# Patient Record
Sex: Female | Born: 1960 | Race: White | Hispanic: No | Marital: Married | Smoking: Never smoker
Health system: Southern US, Community
[De-identification: ages and names within clinical notes are randomized; demographics above are authoritative.]

---

## 2017-03-16 ENCOUNTER — Emergency Department (INDEPENDENT_AMBULATORY_CARE_PROVIDER_SITE_OTHER): Payer: Commercial Managed Care - PPO

## 2017-03-16 ENCOUNTER — Emergency Department
Admission: EM | Admit: 2017-03-16 | Discharge: 2017-03-16 | Disposition: A | Discharge status: Home / Self Care | Payer: Commercial Managed Care - PPO | Source: Home / Self Care | Attending: Family Medicine | Admitting: Family Medicine

## 2017-03-16 ENCOUNTER — Encounter: Payer: Self-pay | Admitting: Emergency Medicine

## 2017-03-16 DIAGNOSIS — S99921A Unspecified injury of right foot, initial encounter: Secondary | ICD-10-CM

## 2017-03-16 DIAGNOSIS — W19XXXA Unspecified fall, initial encounter: Secondary | ICD-10-CM

## 2017-03-16 DIAGNOSIS — Z760 Encounter for issue of repeat prescription: Secondary | ICD-10-CM

## 2017-03-16 DIAGNOSIS — M25571 Pain in right ankle and joints of right foot: Secondary | ICD-10-CM

## 2017-03-16 DIAGNOSIS — M25471 Effusion, right ankle: Secondary | ICD-10-CM

## 2017-03-16 MED ORDER — ALBUTEROL SULFATE (2.5 MG/3ML) 0.083% IN NEBU: 2.5000 mg | INHALATION_SOLUTION | Freq: Four times a day (QID) | RESPIRATORY_TRACT | 5 refills | Status: AC | PRN

## 2017-03-16 NOTE — Discharge Instructions (Signed)
°  Your imaging today was read as no fracture or dislocation, however, if pain, swelling, bruising continue to worsen over the next 1 week, it is recommended you follow up with primary care or Sports Medicine as they may repeat the x-ray to rule out a very tiny fracture (broken bone) as sometimes fractures are so small they are difficult to see until the bone starts to heal.

## 2017-03-16 NOTE — ED Provider Notes (Signed)
CSN: [id number]     Arrival date & time 03/16/17  1058 History   First MD Initiated Contact with Patient 03/16/17 1144     Chief Complaint  Patient presents with  . Ankle Pain   (Consider location/radiation/quality/duration/timing/severity/associated sxs/prior Treatment) HPI  Cindy Becker is a 56 y.o. female presenting to UC with husband c/o Right ankle and foot pain with swelling and bruising that started 2 days ago after trip and fall in the middle of the night while walking to the bathroom.  Pain is aching and sore, worse with ambulation but pt notes she is on chronic pain medication and uses a cane to help ambulate so she is concerned the medication is masking the pain as her foot is more bruised and swollen than 2 days ago.     History reviewed. No pertinent past medical history. History reviewed. No pertinent surgical history. History reviewed. No pertinent family history. Social History  Substance Use Topics  . Smoking status: Never Smoker  . Smokeless tobacco: Never Used  . Alcohol use No   OB History    No data available     Review of Systems  Musculoskeletal: Positive for arthralgias, gait problem, joint swelling and myalgias.  Skin: Positive for color change. Negative for wound.  Neurological: Negative for weakness and numbness.    Allergies  Patient has no allergy information on record.  Home Medications   Prior to Admission medications   Medication Sig Start Date End Date Taking? Authorizing Provider  cyclobenzaprine (FLEXERIL) 10 MG tablet Take 10 mg by mouth 3 (three) times daily as needed for muscle spasms.   Yes Historical Provider, MD  doxycycline (ORACEA) 40 MG capsule Take 40 mg by mouth every morning.   Yes Historical Provider, MD  DULoxetine (CYMBALTA) 20 MG capsule Take 20 mg by mouth daily.   Yes Historical Provider, MD  furosemide (LASIX) 10 MG/ML solution Take by mouth daily.   Yes Historical Provider, MD  lisinopril (PRINIVIL,ZESTRIL) 10 MG tablet  Take 10 mg by mouth daily.   Yes Historical Provider, MD  metoprolol succinate (TOPROL-XL) 100 MG 24 hr tablet Take 100 mg by mouth daily. Take with or immediately following a meal.   Yes Historical Provider, MD  metoprolol-hydrochlorothiazide (LOPRESSOR HCT) 50-25 MG tablet Take 1 tablet by mouth daily.   Yes Historical Provider, MD  pregabalin (LYRICA) 100 MG capsule Take 100 mg by mouth 2 (two) times daily.   Yes Historical Provider, MD  albuterol (PROVENTIL) (2.5 MG/3ML) 0.083% nebulizer solution Take 3 mLs (2.5 mg total) by nebulization every 6 (six) hours as needed for wheezing or shortness of breath. 03/16/17   Junius Finner, PA-C   Meds Ordered and Administered this Visit  Medications - No data to display  BP 90/61 (BP Location: Right Arm)   Temp 97.6 F (36.4 C) (Oral)   Wt 240 lb (108.9 kg)   SpO2 96%  No data found.   Physical Exam  Constitutional: She is oriented to person, place, and time. She appears well-developed and well-nourished.  HENT:  Head: Normocephalic and atraumatic.  Eyes: EOM are normal.  Neck: Normal range of motion.  Cardiovascular: Normal rate.   Pulses:      Dorsalis pedis pulses are 2+ on the right side.  Pulmonary/Chest: Effort normal.  Musculoskeletal: Normal range of motion. She exhibits edema and tenderness.  Right ankle and foot: mild to moderate edema with tenderness to lateral aspect ankle and foot. Full ROM.   Neurological: She is alert  and oriented to person, place, and time.  Antalgic gait. Pt uses cane for assistance ambulating.   Skin: Skin is warm and dry. Capillary refill takes less than 2 seconds.  Right ankle and foot: skin in tact. Ecchymosis noted to foot. Worse over 4th and 5th metatarsals.   Psychiatric: She has a normal mood and affect. Her behavior is normal.  Nursing note and vitals reviewed.   Urgent Care Course     Procedures (including critical care time)  Labs Review Labs Reviewed - No data to display  Imaging  Review Dg Ankle Complete Right  Result Date: 03/16/2017 CLINICAL DATA:  Pt states she fell Thursday night and injured her right ankle/right foot. c/o lateral pain with bruising and swelling. EXAM: RIGHT ANKLE - COMPLETE 3+ VIEW COMPARISON:  None. FINDINGS: Osseous alignment is normal. No fracture line or displaced fracture fragment seen. Ankle mortise is symmetric. Soft tissue swelling, particularly over the lateral malleolus. IMPRESSION: 1. No osseous fracture or dislocation. 2. Soft tissue swelling/edema. Electronically Signed   By: Bary Richard M.D.   On: 03/16/2017 12:25   Dg Foot Complete Right  Result Date: 03/16/2017 CLINICAL DATA:  Pt states she fell Thursday night and injured her right ankle/right foot. c/o lateral pain with bruising and swelling. EXAM: RIGHT FOOT COMPLETE - 3+ VIEW COMPARISON:  None. FINDINGS: Osseous alignment is normal. No fracture line or displaced fracture fragment identified. Soft tissues about the right foot are unremarkable. IMPRESSION: Negative. Electronically Signed   By: Bary Richard M.D.   On: 03/16/2017 12:26      MDM   1. Right foot injury, initial encounter   2. Pain and swelling of right ankle   3. Medication refill    Plain films negative for fracture or dislocation, however, question occult fracture given swelling and bruising.   Will place pt in cam walker boot for 1 week and encouraged to f/u with PCP or Sports Medicine for likely repeat imaging if not improving in 1-2 weeks.  Pt also requesting refill for unrelated albuterol nebulizer solution as she noticed hers is out of date.  Refill sent to pharmacy.   Junius Finner, PA-C 03/16/17 [phone]

## 2017-03-16 NOTE — ED Triage Notes (Signed)
Pt states she fell while walking to the bathroom 2 nights ago and injured both knees and ankles. States pain is worse on right side.

## 2017-10-08 IMAGING — DX DG FOOT COMPLETE 3+V*R*
3 series · 3 of 3 positions shown · non-contrast
Comparison: None.

CLINICAL DATA: Pt states she fell [REDACTED] night and injured her
right ankle/right foot. c/o lateral pain with bruising and swelling.

EXAM:
RIGHT FOOT COMPLETE - 3+ VIEW

[foot ap]
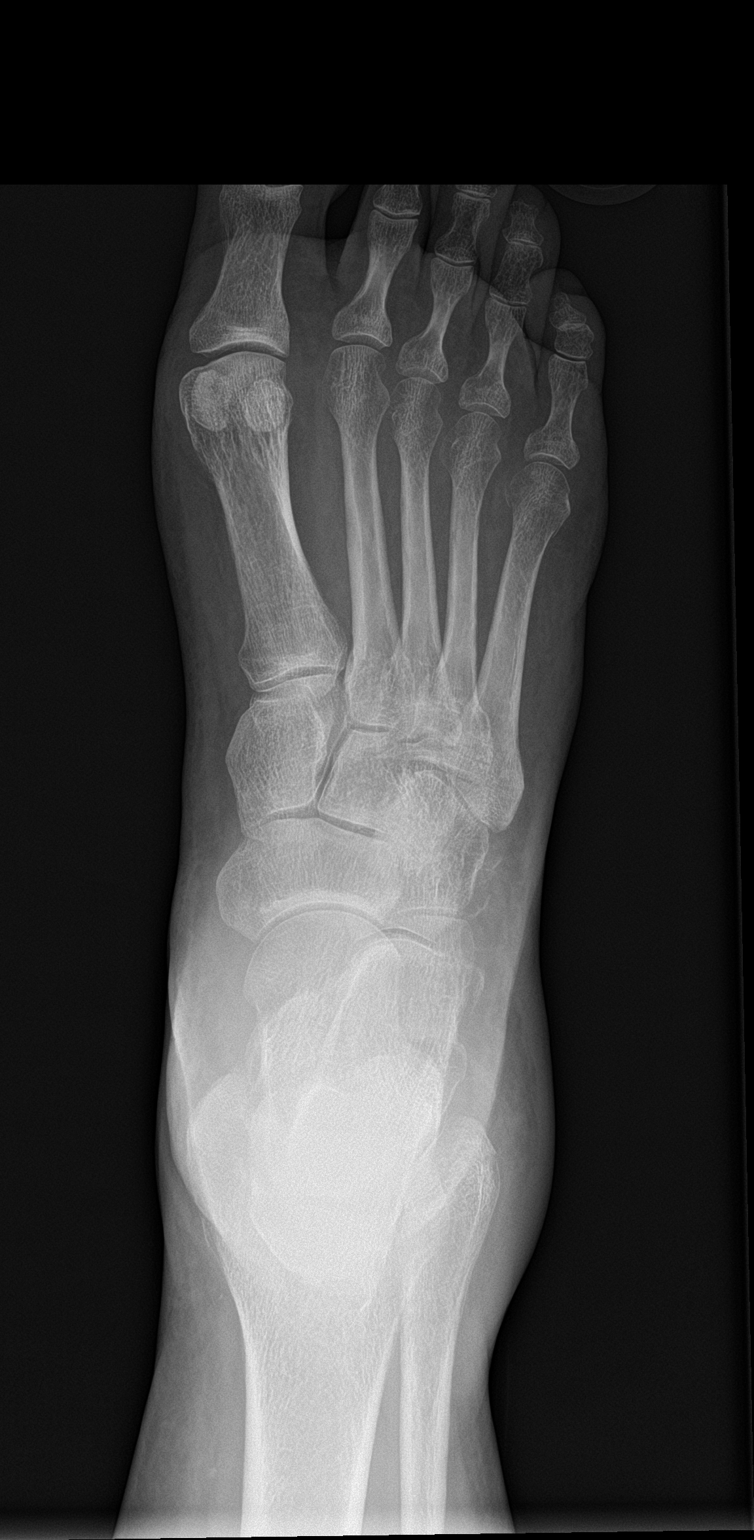

[foot obl]
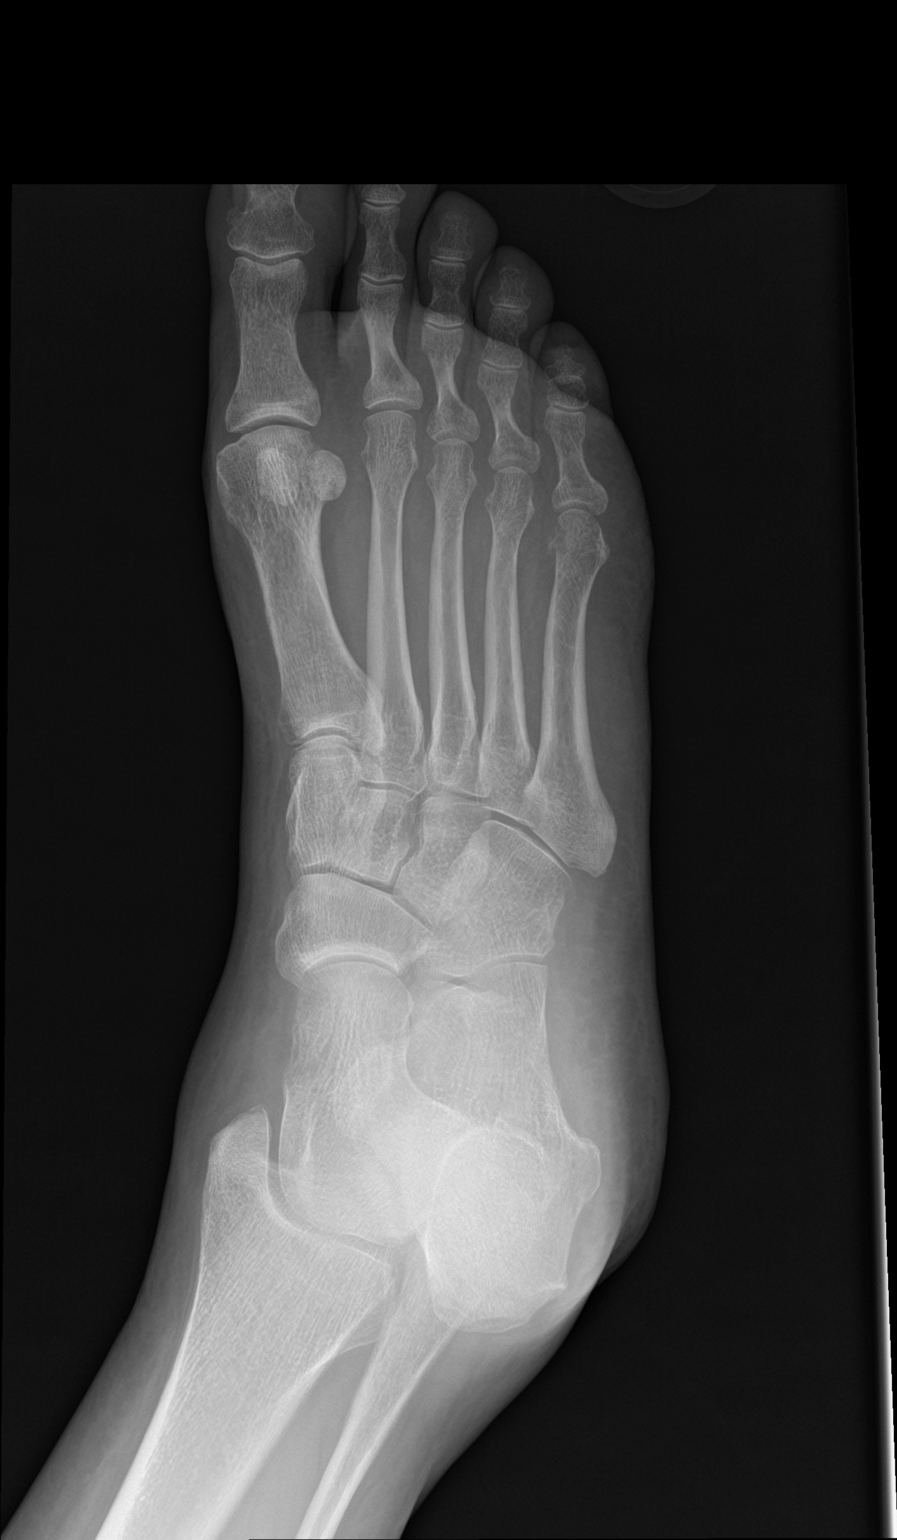

[foot lat]
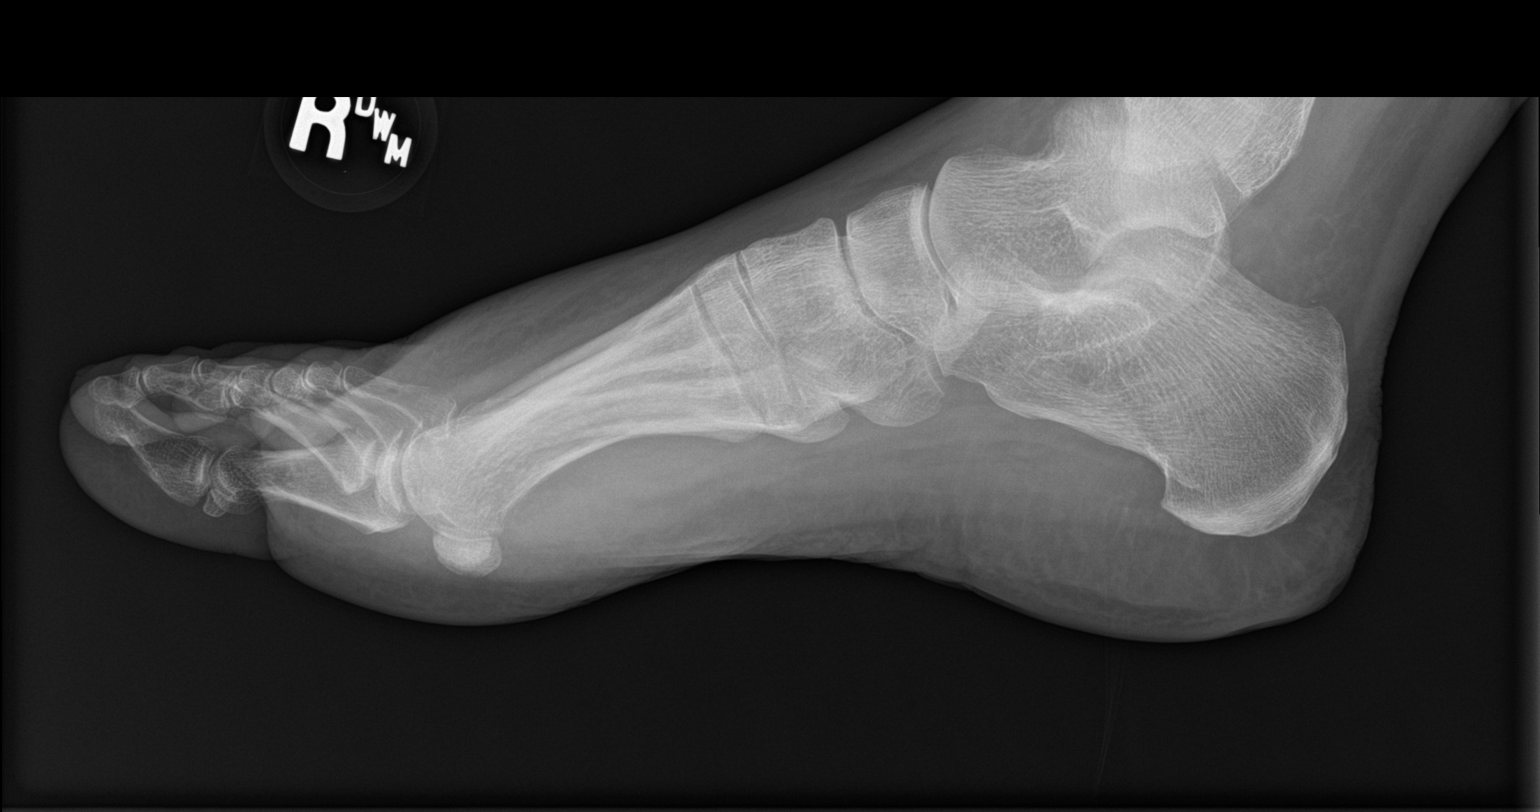

[3 of 3 positions shown; findings below may reference images not displayed]

FINDINGS: Osseous alignment is normal. No fracture line or displaced fracture
fragment identified. Soft tissues about the right foot are
unremarkable.
IMPRESSION: Negative.

## 2017-10-08 IMAGING — DX DG ANKLE COMPLETE 3+V*R*
3 series · 3 of 3 positions shown · non-contrast
Comparison: None.

CLINICAL DATA: Pt states she fell [REDACTED] night and injured her
right ankle/right foot. c/o lateral pain with bruising and swelling.

EXAM:
RIGHT ANKLE - COMPLETE 3+ VIEW

[ankle ap]
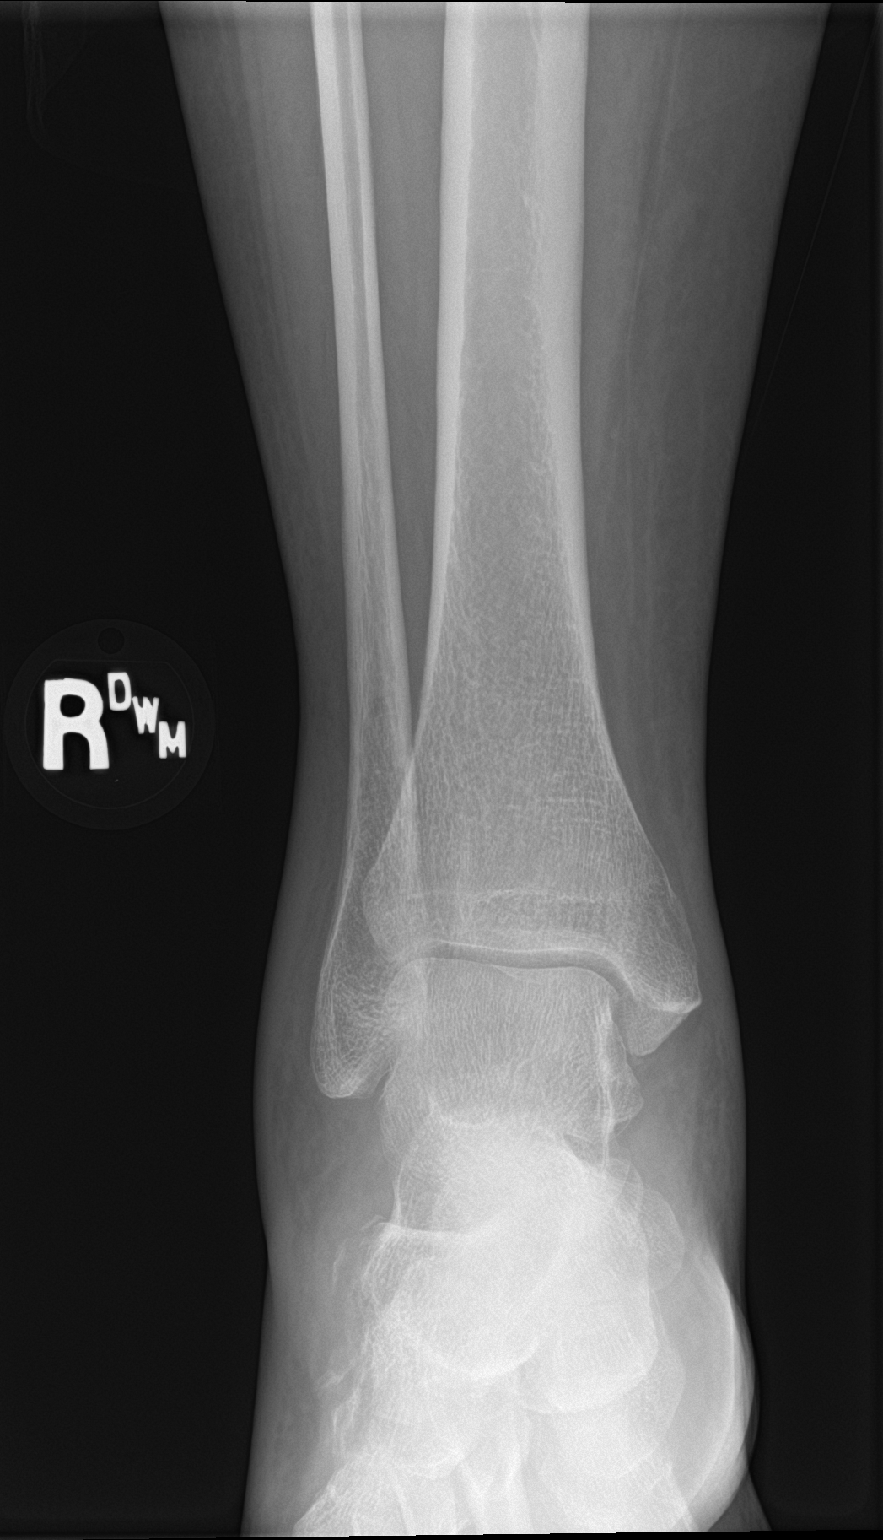

[ankle obl]
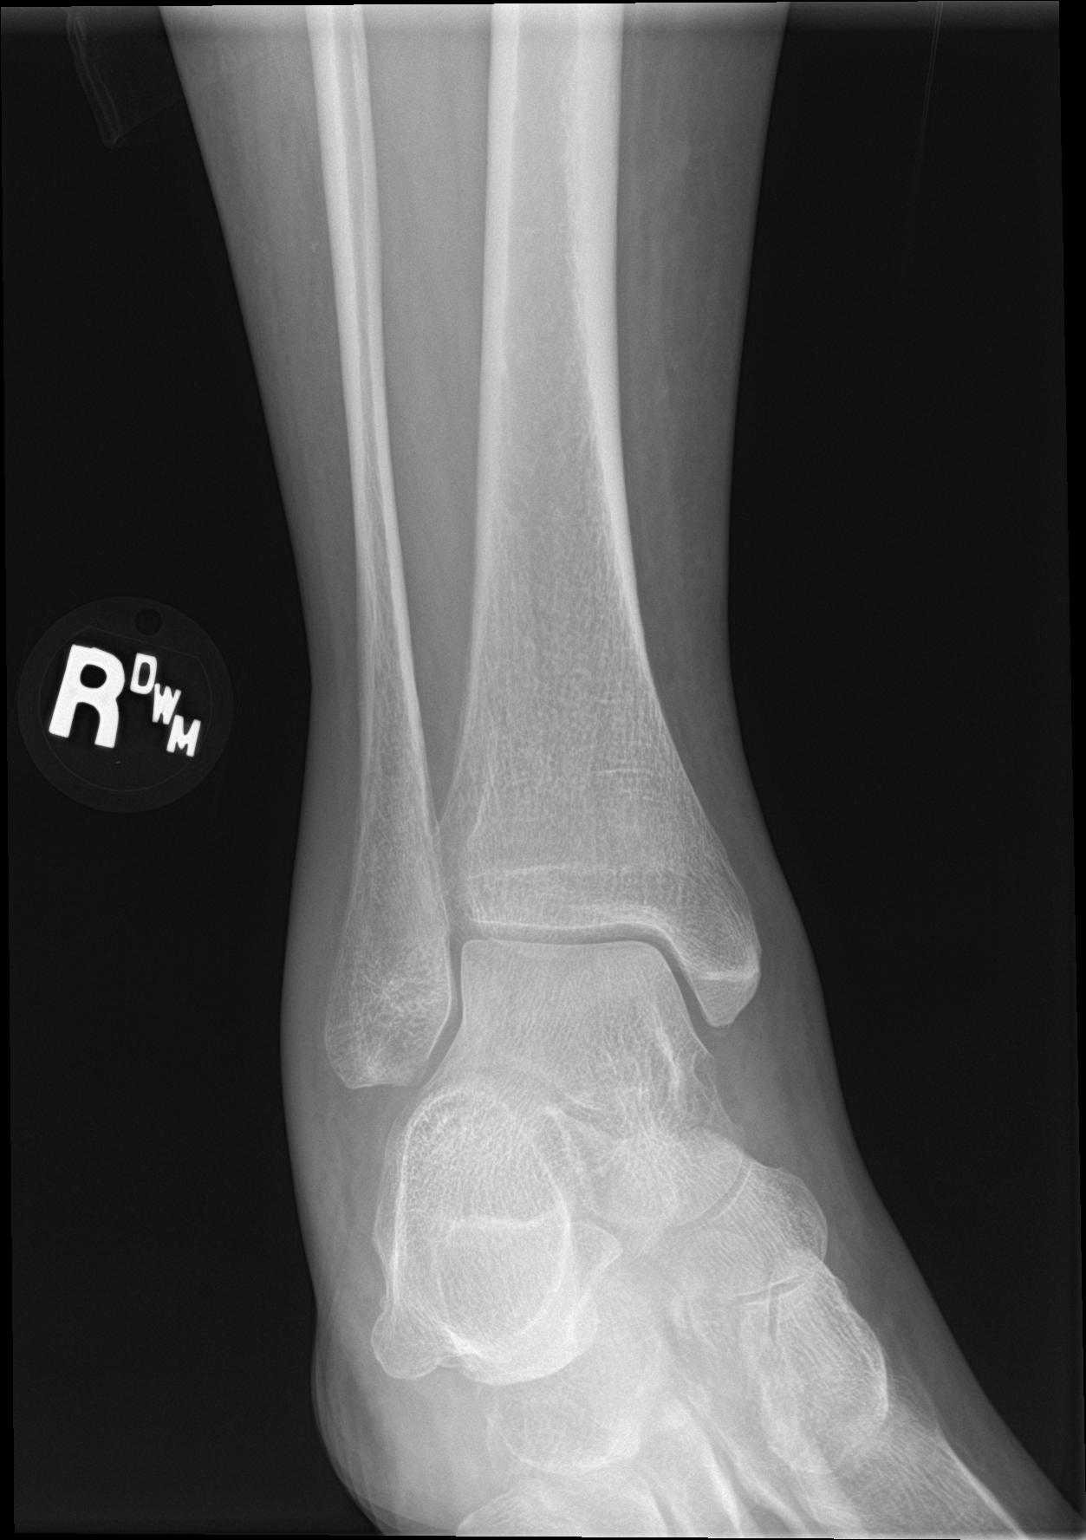

[ankle lat]
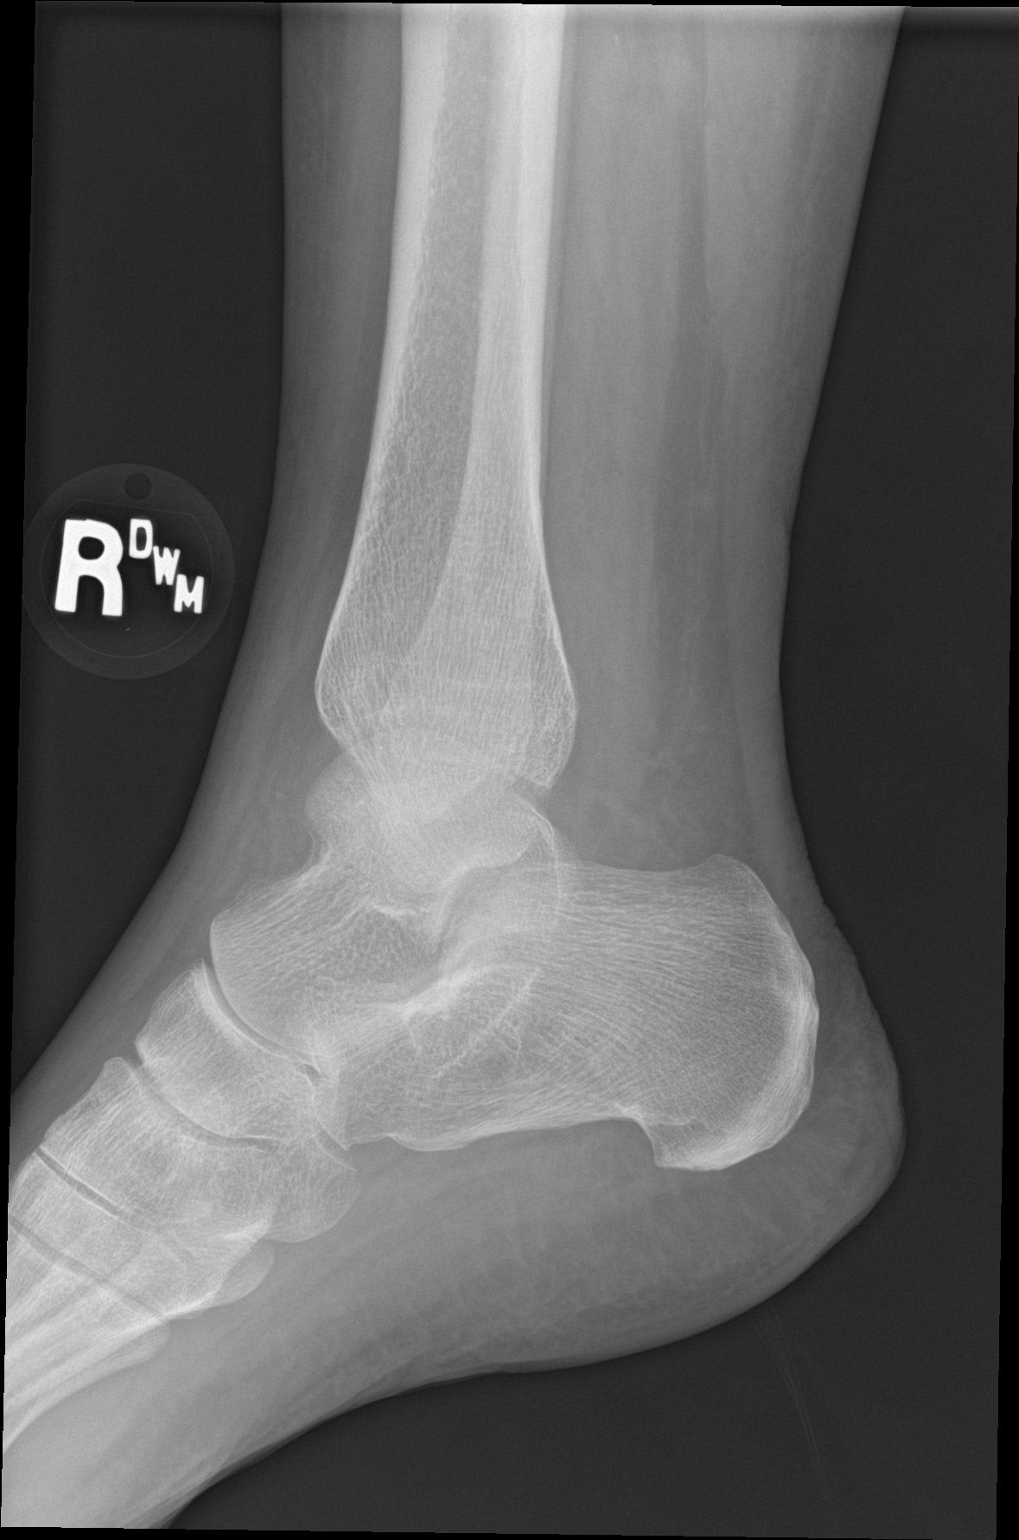

[3 of 3 positions shown; findings below may reference images not displayed]

FINDINGS: Osseous alignment is normal. No fracture line or displaced fracture
fragment seen. Ankle mortise is symmetric. Soft tissue swelling,
particularly over the lateral malleolus.
IMPRESSION: 1. No osseous fracture or dislocation.
2. Soft tissue swelling/edema.
